# Patient Record
Sex: Female | Born: 1950 | Hispanic: No | Marital: Single | Smoking: Never smoker
Health system: Southern US, Community
[De-identification: ages and names within clinical notes are randomized; demographics above are authoritative.]

## PROBLEM LIST (undated history)

## (undated) DIAGNOSIS — R030 Elevated blood-pressure reading, without diagnosis of hypertension: Secondary | ICD-10-CM

## (undated) DIAGNOSIS — J45909 Unspecified asthma, uncomplicated: Secondary | ICD-10-CM

## (undated) DIAGNOSIS — T7840XA Allergy, unspecified, initial encounter: Secondary | ICD-10-CM

## (undated) HISTORY — DX: Elevated blood-pressure reading, without diagnosis of hypertension: R03.0

## (undated) HISTORY — DX: Allergy, unspecified, initial encounter: T78.40XA

---

## 1992-07-15 HISTORY — PX: UTERINE SUSPENSION: SUR1430

## 2015-05-16 DIAGNOSIS — IMO0001 Reserved for inherently not codable concepts without codable children: Secondary | ICD-10-CM

## 2015-05-16 HISTORY — DX: Reserved for inherently not codable concepts without codable children: IMO0001

## 2015-05-20 ENCOUNTER — Emergency Department (INDEPENDENT_AMBULATORY_CARE_PROVIDER_SITE_OTHER): Payer: PRIVATE HEALTH INSURANCE

## 2015-05-20 ENCOUNTER — Encounter (HOSPITAL_COMMUNITY): Payer: Self-pay | Admitting: Emergency Medicine

## 2015-05-20 ENCOUNTER — Emergency Department (INDEPENDENT_AMBULATORY_CARE_PROVIDER_SITE_OTHER)
Admission: EM | Admit: 2015-05-20 | Discharge: 2015-05-20 | Disposition: A | Discharge status: Home / Self Care | Payer: PRIVATE HEALTH INSURANCE | Source: Home / Self Care

## 2015-05-20 DIAGNOSIS — R059 Cough, unspecified: Secondary | ICD-10-CM

## 2015-05-20 DIAGNOSIS — J410 Simple chronic bronchitis: Secondary | ICD-10-CM

## 2015-05-20 DIAGNOSIS — J988 Other specified respiratory disorders: Secondary | ICD-10-CM

## 2015-05-20 DIAGNOSIS — R030 Elevated blood-pressure reading, without diagnosis of hypertension: Secondary | ICD-10-CM

## 2015-05-20 DIAGNOSIS — IMO0001 Reserved for inherently not codable concepts without codable children: Secondary | ICD-10-CM

## 2015-05-20 DIAGNOSIS — R05 Cough: Secondary | ICD-10-CM

## 2015-05-20 HISTORY — DX: Unspecified asthma, uncomplicated: J45.909

## 2015-05-20 MED ORDER — HYDROCODONE-HOMATROPINE 5-1.5 MG/5ML PO SYRP: 5.0000 mL | ORAL_SOLUTION | Freq: Four times a day (QID) | ORAL | Status: DC | PRN

## 2015-05-20 MED ORDER — ALBUTEROL SULFATE HFA 108 (90 BASE) MCG/ACT IN AERS: 2.0000 | INHALATION_SPRAY | Freq: Four times a day (QID) | RESPIRATORY_TRACT | Status: AC | PRN

## 2015-05-20 MED ORDER — AMOXICILLIN 500 MG PO CAPS: 500.0000 mg | ORAL_CAPSULE | Freq: Three times a day (TID) | ORAL | Status: DC

## 2015-05-20 MED ORDER — PREDNISOLONE 5 MG (48) PO TBPK: 1.0000 | ORAL_TABLET | ORAL | Status: AC

## 2015-05-20 NOTE — ED Notes (Signed)
[facility] with a complaint of coughing that has been occurring off and on for the previous three months. The patient stated that she has previously used a Ventolin inhaler to help with the coughing but she is out. She stated that she does have a hx of asthma.

## 2015-05-20 NOTE — Discharge Instructions (Signed)
Chronic Bronchitis Chronic bronchitis is a lasting inflammation of the bronchial tubes, which are the tubes that carry air into your lungs. This is inflammation that occurs:   On most days of the week.   For at least three months at a time.   Over a period of two years in a row. When the bronchial tubes are inflamed, they start to produce mucus. The inflammation and buildup of mucus make it more difficult to breathe. Chronic bronchitis is usually a permanent problem and is one type of chronic obstructive pulmonary disease (COPD). People with chronic bronchitis are at greater risk for getting repeated colds, or respiratory infections. CAUSES  Chronic bronchitis most often occurs in people who have:  Long-standing, severe asthma.  A history of smoking.  Asthma and who also smoke. SIGNS AND SYMPTOMS  Chronic bronchitis may cause the following:   A cough that brings up mucus (productive cough).  Shortness of breath.  Early morning headache.  Wheezing.  Chest discomfort.   Recurring respiratory infections. DIAGNOSIS  Your health care provider may confirm the diagnosis by:  Taking your medical history.  Performing a physical exam.  Taking a chest X-ray.   Performing pulmonary function tests. TREATMENT  Treatment involves controlling symptoms with medicines, oxygen therapy, or making lifestyle changes, such as exercising and eating a healthy, well-balanced diet. Medicines could include:  Inhalers to improve air flow in and out of your lungs.  Antibiotics to treat bacterial infections, such as pneumonia, sinus infections, and acute bronchitis. As a preventative measure, your health care provider may recommend routine vaccinations for influenza and pneumonia. This is to prevent infection and hospitalization since you may be more at risk for these types of infections.  HOME CARE INSTRUCTIONS  Take medicines only as directed by your health care provider.   If you smoke  cigarettes, chew tobacco, or use electronic cigarettes, quit. If you need help quitting, ask your health care provider.  Avoid pollen, dust, animal dander, molds, smoke, and other things that cause shortness of breath or wheezing attacks.  Talk to your health care provider about possible exercise routines. Regular exercise is very important to help you feel better.  If you are prescribed oxygen use at home follow these guidelines:  Never smoke while using oxygen. Oxygen does not burn or explode, but flammable materials will burn faster in the presence of oxygen.  Keep a Data processing manager close by. Let your fire department know that you have oxygen in your home.  Warn visitors not to smoke near you when you are using oxygen. Put up "no smoking" signs in your home where you most often use the oxygen.  Regularly test your smoke detectors at home to make sure they work. If you receive care in your home from a nurse or other health care provider, he or she may also check to make sure your smoke detectors work.  Ask your health care provider whether you would benefit from a pulmonary rehabilitation program.  Do not wait to get medical care if you have any concerning symptoms. Delays could cause permanent injury and may be life threatening. SEEK MEDICAL CARE IF:  You have increased coughing or shortness of breath or both.  You have muscle aches.  You have chest pain.  Your mucus gets thicker.  Your mucus changes from clear or white to yellow, green, gray, or bloody. SEEK IMMEDIATE MEDICAL CARE IF:  Your usual medicines do not stop your wheezing.   You have increased difficulty breathing.  You have any problems with the medicine you are taking, such as a rash, itching, swelling, or trouble breathing. MAKE SURE YOU:   Understand these instructions.  Will watch your condition.  Will get help right away if you are not doing well or get worse.   This information is not intended to  replace advice given to you by your health care provider. Make sure you discuss any questions you have with your health care provider.   Document Released: 04/18/2006 Document Revised: 07/22/2014 Document Reviewed: 08/09/2013 Elsevier Interactive Patient Education 2016 Elsevier Inc.  Managing Your High Blood Pressure Blood pressure is a measurement of how forceful your blood is pressing against the walls of the arteries. Arteries are muscular tubes within the circulatory system. Blood pressure does not stay the same. Blood pressure rises when you are active, excited, or nervous; and it lowers during sleep and relaxation. If the numbers measuring your blood pressure stay above normal most of the time, you are at risk for health problems. High blood pressure (hypertension) is a long-term (chronic) condition in which blood pressure is elevated. A blood pressure reading is recorded as two numbers, such as 120 over 80 (or 120/80). The first, higher number is called the systolic pressure. It is a measure of the pressure in your arteries as the heart beats. The second, lower number is called the diastolic pressure. It is a measure of the pressure in your arteries as the heart relaxes between beats.  Keeping your blood pressure in a normal range is important to your overall health and prevention of health problems, such as heart disease and stroke. When your blood pressure is uncontrolled, your heart has to work harder than normal. High blood pressure is a very common condition in adults because blood pressure tends to rise with age. Men and women are equally likely to have hypertension but at different times in life. Before age 84, men are more likely to have hypertension. After 64 years of age, women are more likely to have it. Hypertension is especially common in African Americans. This condition often has no signs or symptoms. The cause of the condition is usually not known. Your caregiver can help you come up  with a plan to keep your blood pressure in a normal, healthy range. BLOOD PRESSURE STAGES Blood pressure is classified into four stages: normal, prehypertension, stage 1, and stage 2. Your blood pressure reading will be used to determine what type of treatment, if any, is necessary. Appropriate treatment options are tied to these four stages:  Normal  Systolic pressure (mm Hg): below 120.  Diastolic pressure (mm Hg): below 80. Prehypertension  Systolic pressure (mm Hg): 120 to 139.  Diastolic pressure (mm Hg): 80 to 89. Stage1  Systolic pressure (mm Hg): 140 to 159.  Diastolic pressure (mm Hg): 90 to 99. Stage2  Systolic pressure (mm Hg): 160 or above.  Diastolic pressure (mm Hg): 100 or above. RISKS RELATED TO HIGH BLOOD PRESSURE Managing your blood pressure is an important responsibility. Uncontrolled high blood pressure can lead to:  A heart attack.  A stroke.  A weakened blood vessel (aneurysm).  Heart failure.  Kidney damage.  Eye damage.  Metabolic syndrome.  Memory and concentration problems. HOW TO MANAGE YOUR BLOOD PRESSURE Blood pressure can be managed effectively with lifestyle changes and medicines (if needed). Your caregiver will help you come up with a plan to bring your blood pressure within a normal range. Your plan should include the following: Education  Read all information provided by your caregivers about how to control blood pressure.  Educate yourself on the latest guidelines and treatment recommendations. New research is always being done to further define the risks and treatments for high blood pressure. Lifestylechanges  Control your weight.  Avoid smoking.  Stay physically active.  Reduce the amount of salt in your diet.  Reduce stress.  Control any chronic conditions, such as high cholesterol or diabetes.  Reduce your alcohol intake. Medicines  Several medicines (antihypertensive medicines) are available, if needed, to  bring blood pressure within a normal range. Communication  Review all the medicines you take with your caregiver because there may be side effects or interactions.  Talk with your caregiver about your diet, exercise habits, and other lifestyle factors that may be contributing to high blood pressure.  See your caregiver regularly. Your caregiver can help you create and adjust your plan for managing high blood pressure. RECOMMENDATIONS FOR TREATMENT AND FOLLOW-UP  The following recommendations are based on current guidelines for managing high blood pressure in nonpregnant adults. Use these recommendations to identify the proper follow-up period or treatment option based on your blood pressure reading. You can discuss these options with your caregiver.  Systolic pressure of 120 to 139 or diastolic pressure of 80 to 89: Follow up with your caregiver as directed.  Systolic pressure of 140 to 160 or diastolic pressure of 90 to 100: Follow up with your caregiver within 2 months.  Systolic pressure above 160 or diastolic pressure above 100: Follow up with your caregiver within 1 month.  Systolic pressure above 180 or diastolic pressure above 110: Consider antihypertensive therapy; follow up with your caregiver within 1 week.  Systolic pressure above 200 or diastolic pressure above 120: Begin antihypertensive therapy; follow up with your caregiver within 1 week.   This information is not intended to replace advice given to you by your health care provider. Make sure you discuss any questions you have with your health care provider.    It was nice to meet you. You have a respiratory infection in the setting of chronic bronchitis. Treat with antibiotic take all of it. Take prednisone for the inflammation (will be in a pack). Use the cough medicine for night time (it will make you sleepy). Use DELSYM over the counter for day time cough. AVOID anything decongestant it in because of blood pressure. Use  the inhaler as needed.  Please f/u with a PCP for management of your blood pressures.    Document Released: 03/25/2012 Document Reviewed: 03/25/2012 Elsevier Interactive Patient Education [employer]2016 Elsevier Inc.

## 2015-05-20 NOTE — ED Provider Notes (Signed)
CSN: [id number]     Arrival date & time 05/20/15  1258 History   None    Chief Complaint  Patient presents with  . Cough   (Consider location/radiation/quality/duration/timing/severity/associated sxs/prior Treatment) HPI Comments: 64 yo black female who presents with a 3 month history of cough. [place] from [place] and has not required medial attention at this time. No established PCP. Cough is productive, green to yellow. No fever or chills. Positive Malaise. Immunizations up to date. History of Asthma in [place] and uses PRN Ventolin which is without relief. She carries no history of HTN. Non-smoker. No CP or dyspnea. No headaches or change in vision.   Patient is a 64 y.o. female presenting with cough. The history is provided by the patient and a relative.  Cough Associated symptoms: wheezing   Associated symptoms: no chills, no fever, no shortness of breath and no sore throat     Past Medical History  Diagnosis Date  . Asthma    History reviewed. No pertinent past surgical history. History reviewed. No pertinent family history. Social History  Substance Use Topics  . Smoking status: Never Smoker   . Smokeless tobacco: None  . Alcohol Use: No   OB History    No data available     Review of Systems  Constitutional: Positive for fatigue. Negative for fever, chills and unexpected weight change.  HENT: Negative for congestion, sinus pressure and sore throat.   Respiratory: Positive for cough and wheezing. Negative for shortness of breath.   Skin: Negative.   Allergic/Immunologic: Negative.   Neurological: Negative for dizziness.  Psychiatric/Behavioral: Negative.     Allergies  Review of patient's allergies indicates no known allergies.  Home Medications   Prior to Admission medications   Medication Sig Start Date End Date Taking? Authorizing Provider  albuterol (PROVENTIL HFA;VENTOLIN HFA) 108 (90 BASE) MCG/ACT inhaler Inhale 2 puffs into the lungs  every 6 (six) hours as needed for wheezing or shortness of breath. 05/20/15   Riki SheerMichelle G Bethannie Iglehart, PA-C  amoxicillin (AMOXIL) 500 MG capsule Take 1 capsule (500 mg total) by mouth 3 (three) times daily. 05/20/15   Riki SheerMichelle G Kiyanna Biegler, PA-C  HYDROcodone-homatropine (HYCODAN) 5-1.5 MG/5ML syrup Take 5 mLs by mouth every 6 (six) hours as needed for cough. 05/20/15   Riki SheerMichelle G Marysa Wessner, PA-C  PrednisoLONE 5 MG (48) TBPK Take 1 Package by mouth as directed. 05/20/15 05/31/15  Riki SheerMichelle G Adison Reifsteck, PA-C   Meds Ordered and Administered this Visit  Medications - No data to display  BP 161/104 mmHg  Pulse 78  Temp(Src) 98.2 F (36.8 C) (Oral)  Resp 18  SpO2 99% No data found.   Physical Exam  Constitutional: She is oriented to person, place, and time. She appears well-developed and well-nourished. No distress.  HENT:  Head: Normocephalic and atraumatic.  Neck: Normal range of motion.  Cardiovascular: Normal rate, regular rhythm and normal heart sounds.   Pulmonary/Chest: Effort normal. No respiratory distress. She has wheezes. She exhibits no tenderness.  Few mild crackles and rhonchi in the bases, mild expiratory wheeze  Lymphadenopathy:    She has no cervical adenopathy.  Neurological: She is alert and oriented to person, place, and time.  Skin: Skin is warm and dry. No rash noted. She is not diaphoretic.  Psychiatric: Her behavior is normal.  Nursing note and vitals reviewed.   ED Course  Procedures (including critical care time)  Labs Review Labs Reviewed - No data to display  Imaging  Review Dg Chest 2 View  05/20/2015  CLINICAL DATA:  Cough for the past 3 months.  Night sweats.  Asthma. EXAM: CHEST  2 VIEW COMPARISON:  None. FINDINGS: Borderline enlarged cardiac silhouette. Tortuous and partially calcified thoracic aorta. The lungs are mildly hyperexpanded with mild diffuse peribronchial thickening. Mild thoracic spine degenerative changes. IMPRESSION: 1. Mild changes of COPD and chronic  bronchitis. 2. Borderline cardiomegaly. Electronically Signed   By: Beckie Salts M.D.   On: 05/20/2015 13:56     Visual Acuity Review  Right Eye Distance:   Left Eye Distance:   Bilateral Distance:    Right Eye Near:   Left Eye Near:    Bilateral Near:         MDM   1. Respiratory infection   2. Simple chronic bronchitis (HCC)   3. Cough   4. Elevated blood pressure    1-3.  No indication of PNA by CXR however given duration of symptoms will treat with ABX. Steroids for chronic inflammation and cough suppressant as needed at night. Rescue inhaler is refilled to use as directed. If worsens please f/u.  4. Elevated acutely in the setting of illness. No history. Suggest patient establish with PCP for further f/u-information given to arrange this. She is asymptomatic of HTN.       Riki Sheer, PA-C 05/20/15 1425

## 2015-05-30 ENCOUNTER — Encounter: Payer: Self-pay | Admitting: Internal Medicine

## 2015-05-30 ENCOUNTER — Ambulatory Visit (INDEPENDENT_AMBULATORY_CARE_PROVIDER_SITE_OTHER): Payer: Self-pay | Admitting: Internal Medicine

## 2015-05-30 VITALS — BP 144/90 | HR 80 | Ht 65.0 in | Wt 165.0 lb

## 2015-05-30 DIAGNOSIS — R739 Hyperglycemia, unspecified: Secondary | ICD-10-CM

## 2015-05-30 DIAGNOSIS — J45909 Unspecified asthma, uncomplicated: Secondary | ICD-10-CM | POA: Insufficient documentation

## 2015-05-30 DIAGNOSIS — T7840XA Allergy, unspecified, initial encounter: Secondary | ICD-10-CM | POA: Insufficient documentation

## 2015-05-30 DIAGNOSIS — R030 Elevated blood-pressure reading, without diagnosis of hypertension: Secondary | ICD-10-CM | POA: Insufficient documentation

## 2015-05-30 MED ORDER — FEXOFENADINE HCL 180 MG PO TABS: 180.0000 mg | ORAL_TABLET | Freq: Every day | ORAL | Status: AC

## 2015-05-30 NOTE — Progress Notes (Signed)
   Subjective:    Patient ID: Whitney Lucas, female    DOB: 09/23/1950, 64 y.o.   MRN: [id number]  HPI  64 yo female here to establish.  1.  Elevated Blood Pressure:  Was seen in Urgent Care for acute bronchitis about 10 days ago and was noted to have elevated bp.  Has never had high bp previously.  However had not had bp checked in 2-3 years.  No family history of hypertension.  No blood work done with urgent care visit.   2.  "Allergies":  Sounds like she is describing wheezing mainly, but also has itchy nose, ears,  and eyes without discharge.  + posterior pharyngeal drainage.  Really, a cough is her most prominent problem and that improves with a rescue inhaler.  States her symptoms are similar to when [place] lived in [place]. Symptoms can come on any time of year.  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  +Carpeting.  Vacuumed once a week or less. No mattress or pillow covers.   No insects or moisture issues, smell of mold or mildew.  Took unknown allergy pill from [place] in past, but stopped as her symptoms would return when she stopped the meds.             Review of Systems     Objective:   Physical Exam HEENT:  PERRL EOMI, Discs sharp.  No AV nicking noted Neck:  Supple, no adenopathy, no thyromegaly Chest:  CTA CV:  RRR with normal S1 and S2, no S3, S4, or murmur appreciated.  No carotid bruits, carotid, radial and DP pulses normal and equal. Abd:  S, NT, No HSM or mass. No bruit, +BS Extrems:  No edema Feet:  Flaking in between 4th and 5th toes and dorsum of foot in between        Assessment & Plan:  1.  Elevated BP without dx of hypertension:  Recheck bp when not in a stressful/anxiety provoking situation.  To return for nurse bp check.  2.  Allergies:  Discussed prevention/avoidance.  Vacuuming and cleaning bedclothes and pillow/mattress cover.  Fexofenadine 180 mg once daily to prevent allergies from triggering wheeze.  3.  Tinea Pedis:  Terbinafine cream  twice daily for 2 weeks.

## 2015-05-30 NOTE — Patient Instructions (Addendum)
Fexofenadine 180 mg once daily for allergies Get hypoallergenic mattress and pillow covers and wipe down weekly with Lysol wipes when you wash your sheets and blankets. Vacuum bed of dust when you change your sheets and blankets weekly Vacuum carpets twice weekly. Take shoes off at entrance door to avoid bringing in allergens from outdoors.  For feet rash:  Terbinafine cream--apply twice daily to areas of itching for 14 days.

## 2015-05-31 LAB — COMPREHENSIVE METABOLIC PANEL
ALBUMIN: 4 g/dL (ref 3.6–4.8)
ALK PHOS: 98 IU/L (ref 39–117)
ALT: 11 IU/L (ref 0–32)
AST: 11 IU/L (ref 0–40)
Albumin/Globulin Ratio: 1.3 (ref 1.1–2.5)
BUN / CREAT RATIO: 25 (ref 11–26)
BUN: 18 mg/dL (ref 8–27)
CHLORIDE: 100 mmol/L (ref 97–106)
CO2: 24 mmol/L (ref 18–29)
Calcium: 9.1 mg/dL (ref 8.7–10.3)
Creatinine, Ser: 0.71 mg/dL (ref 0.57–1.00)
GFR calc Af Amer: 104 mL/min/{1.73_m2} (ref >59)
GFR calc non Af Amer: 90 mL/min/{1.73_m2} (ref >59)
GLUCOSE: 119 mg/dL — AB (ref 65–99)
Globulin, Total: 3.2 g/dL (ref 1.5–4.5)
Potassium: 5 mmol/L (ref 3.5–5.2)
SODIUM: 139 mmol/L (ref 136–144)
Total Protein: 7.2 g/dL (ref 6.0–8.5)

## 2015-05-31 LAB — CBC WITH DIFFERENTIAL/PLATELET
BASOS ABS: 0.1 10*3/uL (ref 0.0–0.2)
Basos: 0 %
EOS (ABSOLUTE): 0.2 10*3/uL (ref 0.0–0.4)
Eos: 1 %
Hematocrit: 39.4 % (ref 34.0–46.6)
Hemoglobin: 12.9 g/dL (ref 11.1–15.9)
Immature Grans (Abs): 0.1 10*3/uL (ref 0.0–0.1)
Immature Granulocytes: 1 %
LYMPHS ABS: 2.2 10*3/uL (ref 0.7–3.1)
Lymphs: 16 %
MCH: 25 pg — ABNORMAL LOW (ref 26.6–33.0)
MCHC: 32.7 g/dL (ref 31.5–35.7)
MCV: 77 fL — ABNORMAL LOW (ref 79–97)
MONOCYTES: 5 %
MONOS ABS: 0.7 10*3/uL (ref 0.1–0.9)
NEUTROS PCT: 77 %
Neutrophils Absolute: 10.2 10*3/uL — ABNORMAL HIGH (ref 1.4–7.0)
Platelets: 425 10*3/uL — ABNORMAL HIGH (ref 150–379)
RBC: 5.15 x10E6/uL (ref 3.77–5.28)
RDW: 16.4 % — AB (ref 12.3–15.4)
WBC: 13.4 10*3/uL — AB (ref 3.4–10.8)

## 2015-05-31 NOTE — Addendum Note (Signed)
Addended by: Marcene DuosMULBERRY, Avishai Reihl M on: 05/31/2015 01:32 PM   Modules accepted: Orders

## 2015-06-02 LAB — HGB A1C W/O EAG: Hgb A1c MFr Bld: 6 % — ABNORMAL HIGH (ref 4.8–5.6)

## 2015-06-02 LAB — SPECIMEN STATUS REPORT

## 2015-06-05 ENCOUNTER — Other Ambulatory Visit (INDEPENDENT_AMBULATORY_CARE_PROVIDER_SITE_OTHER): Payer: Self-pay | Admitting: Internal Medicine

## 2015-06-05 DIAGNOSIS — D72829 Elevated white blood cell count, unspecified: Secondary | ICD-10-CM | POA: Insufficient documentation

## 2015-06-06 ENCOUNTER — Encounter (HOSPITAL_COMMUNITY): Payer: Self-pay | Admitting: Emergency Medicine

## 2015-06-06 DIAGNOSIS — R002 Palpitations: Secondary | ICD-10-CM | POA: Insufficient documentation

## 2015-06-06 DIAGNOSIS — Z79899 Other long term (current) drug therapy: Secondary | ICD-10-CM | POA: Insufficient documentation

## 2015-06-06 DIAGNOSIS — I10 Essential (primary) hypertension: Secondary | ICD-10-CM | POA: Insufficient documentation

## 2015-06-06 DIAGNOSIS — J45909 Unspecified asthma, uncomplicated: Secondary | ICD-10-CM | POA: Insufficient documentation

## 2015-06-06 LAB — CBC
HEMATOCRIT: 39.5 % (ref 36.0–46.0)
HEMOGLOBIN: 13.2 g/dL (ref 12.0–15.0)
MCH: 25.2 pg — ABNORMAL LOW (ref 26.0–34.0)
MCHC: 33.4 g/dL (ref 30.0–36.0)
MCV: 75.5 fL — AB (ref 78.0–100.0)
Platelets: 332 10*3/uL (ref 150–400)
RBC: 5.23 MIL/uL — ABNORMAL HIGH (ref 3.87–5.11)
RDW: 15.4 % (ref 11.5–15.5)
WBC: 9.6 10*3/uL (ref 4.0–10.5)

## 2015-06-06 LAB — CBC WITH DIFFERENTIAL/PLATELET
BASOS ABS: 0 10*3/uL (ref 0.0–0.2)
Basos: 0 %
EOS (ABSOLUTE): 0.4 10*3/uL (ref 0.0–0.4)
EOS: 4 %
Hematocrit: 40.6 % (ref 34.0–46.6)
Hemoglobin: 13.5 g/dL (ref 11.1–15.9)
IMMATURE GRANULOCYTES: 1 %
Immature Grans (Abs): 0.1 10*3/uL (ref 0.0–0.1)
LYMPHS ABS: 3.1 10*3/uL (ref 0.7–3.1)
Lymphs: 30 %
MCH: 25.5 pg — ABNORMAL LOW (ref 26.6–33.0)
MCHC: 33.3 g/dL (ref 31.5–35.7)
MCV: 77 fL — ABNORMAL LOW (ref 79–97)
MONOS ABS: 0.7 10*3/uL (ref 0.1–0.9)
Monocytes: 7 %
NEUTROS PCT: 58 %
Neutrophils Absolute: 5.9 10*3/uL (ref 1.4–7.0)
PLATELETS: 384 10*3/uL — AB (ref 150–379)
RBC: 5.29 x10E6/uL — AB (ref 3.77–5.28)
RDW: 16.7 % — AB (ref 12.3–15.4)
WBC: 10.2 10*3/uL (ref 3.4–10.8)

## 2015-06-06 LAB — BASIC METABOLIC PANEL
ANION GAP: 8 (ref 5–15)
BUN: 17 mg/dL (ref 6–20)
CO2: 23 mmol/L (ref 22–32)
Calcium: 8.9 mg/dL (ref 8.9–10.3)
Chloride: 105 mmol/L (ref 101–111)
Creatinine, Ser: 0.93 mg/dL (ref 0.44–1.00)
Glucose, Bld: 104 mg/dL — ABNORMAL HIGH (ref 65–99)
Potassium: 4.3 mmol/L (ref 3.5–5.1)
Sodium: 136 mmol/L (ref 135–145)

## 2015-06-06 LAB — I-STAT TROPONIN, ED: Troponin i, poc: 0.03 ng/mL (ref 0.00–0.08)

## 2015-06-06 NOTE — ED Notes (Addendum)
Pt states around 2pm she started having a mild headache 6/10 in "whole head' she has also felt a little fatigued today.Pt also reports this am felt like her heart was beating fast that only lasted for a few seconds. They were at [facility] and took pts b/p and bp read, "high" so decided to come be seen. BP on arrival to ED 136/79. Pt is alert and ox4. Pt has no neuro deficits .

## 2015-06-07 ENCOUNTER — Emergency Department (HOSPITAL_COMMUNITY)
Admission: EM | Admit: 2015-06-07 | Discharge: 2015-06-07 | Disposition: A | Discharge status: Home / Self Care | Payer: PRIVATE HEALTH INSURANCE | Attending: Emergency Medicine | Admitting: Emergency Medicine

## 2015-06-07 DIAGNOSIS — R51 Headache: Secondary | ICD-10-CM

## 2015-06-07 DIAGNOSIS — R002 Palpitations: Secondary | ICD-10-CM

## 2015-06-07 DIAGNOSIS — I10 Essential (primary) hypertension: Secondary | ICD-10-CM

## 2015-06-07 DIAGNOSIS — R519 Headache, unspecified: Secondary | ICD-10-CM

## 2015-06-07 MED ORDER — METOPROLOL TARTRATE 25 MG PO TABS: 25.0000 mg | ORAL_TABLET | Freq: Two times a day (BID) | ORAL | Status: AC

## 2015-06-07 NOTE — Discharge Instructions (Signed)
General Headache Without Cause A headache is pain or discomfort felt around the head or neck area. There are many causes and types of headaches. In some cases, the cause may not be found.  HOME CARE  Managing Pain  Take over-the-counter and prescription medicines only as told by your doctor.  Lie down in a dark, quiet room when you have a headache.  If directed, apply ice to the head and neck area:  Put ice in a plastic bag.  Place a towel between your skin and the bag.  Leave the ice on for 20 minutes, 2-3 times per day.  Use a heating pad or hot shower to apply heat to the head and neck area as told by your doctor.  Keep lights dim if bright lights bother you or make your headaches worse. Eating and Drinking  Eat meals on a regular schedule.  Lessen how much alcohol you drink.  Lessen how much caffeine you drink, or stop drinking caffeine. General Instructions  Keep all follow-up visits as told by your doctor. This is important.  Keep a journal to find out if certain things bring on headaches. For example, write down:  What you eat and drink.  How much sleep you get.  Any change to your diet or medicines.  Relax by getting a massage or doing other relaxing activities.  Lessen stress.  Sit up straight. Do not tighten (tense) your muscles.  Do not use tobacco products. This includes cigarettes, chewing tobacco, or e-cigarettes. If you need help quitting, ask your doctor.  Exercise regularly as told by your doctor.  Get enough sleep. This often means 7-9 hours of sleep. GET HELP IF:  Your symptoms are not helped by medicine.  You have a headache that feels different than the other headaches.  You feel sick to your stomach (nauseous) or you throw up (vomit).  You have a fever. GET HELP RIGHT AWAY IF:   Your headache becomes really bad.  You keep throwing up.  You have a stiff neck.  You have trouble seeing.  You have trouble speaking.  You have  pain in the eye or ear.  Your muscles are weak or you lose muscle control.  You lose your balance or have trouble walking.  You feel like you will pass out (faint) or you pass out.  You have confusion.   This information is not intended to replace advice given to you by your health care provider. Make sure you discuss any questions you have with your health care provider.   Document Released: 04/09/2008 Document Revised: 03/22/2015 Document Reviewed: 10/24/2014 Elsevier Interactive Patient Education 2016 [employer].  Hypertension Hypertension is another name for high blood pressure. High blood pressure forces your heart to work harder to pump blood. A blood pressure reading has two numbers, which includes a higher number over a lower number (example: 110/72). HOME CARE   Have your blood pressure rechecked by your doctor.  Only take medicine as told by your doctor. Follow the directions carefully. The medicine does not work as well if you skip doses. Skipping doses also puts you at risk for problems.  Do not smoke.  Monitor your blood pressure at home as told by your doctor. GET HELP IF:  You think you are having a reaction to the medicine you are taking.  You have repeat headaches or feel dizzy.  You have puffiness (swelling) in your ankles.  You have trouble with your vision. GET HELP RIGHT AWAY IF:  You get a very bad headache and are confused.  You feel weak, numb, or faint.  You get chest or belly (abdominal) pain.  You throw up (vomit).  You cannot breathe very well. MAKE SURE YOU:   Understand these instructions.  Will watch your condition.  Will get help right away if you are not doing well or get worse.   This information is not intended to replace advice given to you by your health care provider. Make sure you discuss any questions you have with your health care provider.   Document Released: 12/18/2007 Document Revised: 07/06/2013 Document  Reviewed: 04/23/2013 Elsevier Interactive Patient Education 2016 [employer]  Palpitations A palpitation is the feeling that your heartbeat is irregular. It may feel like your heart is fluttering or skipping a beat. It may also feel like your heart is beating faster than normal. This is usually not a serious problem. In some cases, you may need more medical tests. HOME CARE  Avoid:  Caffeine in coffee, tea, soft drinks, diet pills, and energy drinks.  Chocolate.  Alcohol.  Stop smoking if you smoke.  Reduce your stress and anxiety. Try:  A method that measures bodily functions so you can learn to control them (biofeedback).  Yoga.  Meditation.  Physical activity such as swimming, jogging, or walking.  Get plenty of rest and sleep. GET HELP IF:  Your fast or irregular heartbeat continues after 24 hours.  Your palpitations occur more often. GET HELP RIGHT AWAY IF:   You have chest pain.  You feel short of breath.  You have a very bad headache.  You feel dizzy or pass out (faint). MAKE SURE YOU:   Understand these instructions.  Will watch your condition.  Will get help right away if you are not doing well or get worse.   This information is not intended to replace advice given to you by your health care provider. Make sure you discuss any questions you have with your health care provider.   Document Released: 04/09/2008 Document Revised: 07/22/2014 Document Reviewed: 08/30/2011 Elsevier Interactive Patient Education [employer] Elsevier Inc.

## 2015-06-07 NOTE — ED Provider Notes (Signed)
CSN: [id number]     Arrival date & time 06/06/15  2006 History  By signing my name below, I, Emmanuella Mensah, attest that this documentation has been prepared under the direction and in the presence of Gilda Crease, MD. Electronically Signed: Angelene Giovanni, ED Scribe. 06/07/2015. 1:06 AM.    Chief Complaint  Patient presents with  . Headache   The history is provided by the patient. No language interpreter was used.   HPI Comments: Whitney Lucas is a 64 y.o. female who presents to the Emergency Department complaining of gradually improving generalized throbbing HA onset yesterday afternoon. She explains that her HA started out slowly and gradually worsened before she could get relief. She reports associated episodes of palpitations that lasted a few seconds. She adds that the palpitations resolved after taking a heart medication prescribed by her PCP. She denies any visual disturbances, SOB, or numbness/tingling in arms. She report that she took one aspirin for her symptoms. She states that she is here because she went to check her BP at [facility] and the machine read "high".    Past Medical History  Diagnosis Date  . Asthma     Allergy symptoms trigger  . Allergy     Eye, ear, nose, throat symptoms  . Elevated BP 05/2015   Past Surgical History  Procedure Laterality Date  . Uterine suspension  1994    Had pelvic relaxation surgery   No family history on file. Social History  Substance Use Topics  . Smoking status: Never Smoker   . Smokeless tobacco: Never Used  . Alcohol Use: No   OB History    No data available     Review of Systems  Constitutional: Negative for fever.  Eyes: Negative for visual disturbance.  Respiratory: Negative for shortness of breath.   Cardiovascular: Positive for palpitations.  Neurological: Positive for headaches. Negative for numbness.  All other systems reviewed and are negative.     Allergies  Review of patient's allergies  indicates no known allergies.  Home Medications   Prior to Admission medications   Medication Sig Start Date End Date Taking? Authorizing Provider  albuterol (PROVENTIL HFA;VENTOLIN HFA) 108 (90 BASE) MCG/ACT inhaler Inhale 2 puffs into the lungs every 6 (six) hours as needed for wheezing or shortness of breath. Patient not taking: Reported on 05/30/2015 05/20/15   Riki Sheer, PA-C  fexofenadine (ALLEGRA) 180 MG tablet Take 1 tablet (180 mg total) by mouth daily. 05/30/15   Julieanne Manson, MD  metoprolol (LOPRESSOR) 25 MG tablet Take 1 tablet (25 mg total) by mouth 2 (two) times daily. 06/07/15   Gilda Crease, MD   BP 137/79 mmHg  Pulse 58  Temp(Src) 98.3 F (36.8 C) (Oral)  Resp 18  Ht  (1.702 m)  Wt 170 lb (77.111 kg)  BMI 26.62 kg/m2  SpO2 99% Physical Exam  Constitutional: She is oriented to person, place, and time. She appears well-developed and well-nourished. No distress.  HENT:  Head: Normocephalic and atraumatic.  Right Ear: Hearing normal.  Left Ear: Hearing normal.  Nose: Nose normal.  Mouth/Throat: Oropharynx is clear and moist and mucous membranes are normal.  Eyes: Conjunctivae and EOM are normal. Pupils are equal, round, and reactive to light.  Neck: Normal range of motion. Neck supple.  Cardiovascular: Regular rhythm, S1 normal and S2 normal.  Exam reveals no gallop and no friction rub.   No murmur heard. Pulmonary/Chest: Effort normal and breath sounds normal. No respiratory distress. She  exhibits no tenderness.  Abdominal: Soft. Normal appearance and bowel sounds are normal. There is no hepatosplenomegaly. There is no tenderness. There is no rebound, no guarding, no tenderness at McBurney's point and negative Murphy's sign. No hernia.  Musculoskeletal: Normal range of motion.  Neurological: She is alert and oriented to person, place, and time. She has normal strength. No cranial nerve deficit or sensory deficit. Coordination normal. GCS eye  subscore is 4. GCS verbal subscore is 5. GCS motor subscore is 6.  Extraocular muscle movement: normal No visual field cut Pupils: equal and reactive both direct and consensual response is normal No nystagmus present    Sensory function is intact to light touch, pinprick Proprioception intact  Grip strength 5/5 symmetric in upper extremities No pronator drift Normal finger to nose bilaterally  Lower extremity strength 5/5 against gravity Normal heel to shin bilaterally  Gait: normal   Skin: Skin is warm, dry and intact. No rash noted. No cyanosis.  Psychiatric: She has a normal mood and affect. Her speech is normal and behavior is normal. Thought content normal.  Nursing note and vitals reviewed.   ED Course  Procedures (including critical care time) DIAGNOSTIC STUDIES: Oxygen Saturation is 98% on RA, normal by my interpretation.    COORDINATION OF CARE: 12:25 AM- Pt advised of plan for treatment and pt agrees. Will refer to Cardiologist.     Labs Review Labs Reviewed  BASIC METABOLIC PANEL - Abnormal; Notable for the following:    Glucose, Bld 104 (*)    All other components within normal limits  CBC - Abnormal; Notable for the following:    RBC 5.23 (*)    MCV 75.5 (*)    MCH 25.2 (*)    All other components within normal limits  I-STAT TROPOININ, ED    Imaging Review No results found.   Gilda Crease, MD has personally reviewed and evaluated these images and lab results as part of her medical decision-making.   EKG Interpretation None       ED ECG REPORT   Date: 06/07/2015  Rate: 68  Rhythm: normal sinus rhythm  QRS Axis: normal  Intervals: normal  ST/T Wave abnormalities: normal  Conduction Disutrbances:none  Narrative Interpretation: normal EKG  Old EKG Reviewed: none available  I have personally reviewed the EKG tracing and agree with the computerized printout as noted.   MDM   Final diagnoses:  Headache, unspecified headache type   Essential hypertension  Heart palpitations    Presented to the ER for evaluation of multiple problems. Patient reports that she developed headache around 2 PM. She reports that it was mild at first but then progressively worsened, slowly over time. She reports throbbing headache across the back of her head. He cut better after she took Tylenol and is currently completely resolved. When she was experiencing a headache, she went to [facility] and took her blood pressure and it was high. Reviewing the records reveals that her blood pressure has been high on multiple occasions in the past. She has been seen in urgent care recently and her blood pressure was 160/100 that time. She likely has some element of essential hypertension. As a headache is completely resolved and she has a normal neurologic evaluation, she does not require any imaging at this time.  She also reports that earlier today, around 9:00am she experienced palpitations. She reports that her heart was racing, lasted for a short period of time and then resolved. This has occurred in the past  as well. There is no associated chest pain. She has not identified anything that causes her improved symptoms. I will therefore refer her to cardiology for further testing and monitoring. She has not had any symptoms or arrhythmia here in the ER. Will initiate Lopressor for symptomatic treatment of her palpitations as well as blood pressure control, pending follow-up with cardiology.  I personally performed the services described in this documentation, which was scribed in my presence. The recorded information has been reviewed and is accurate.   Gilda Creasehristopher J Christpher Stogsdill, MD 06/07/15 [phone]

## 2015-06-14 NOTE — Progress Notes (Signed)
Quick Note:  06/14/15 Patient informed. ______

## 2015-06-14 NOTE — Progress Notes (Signed)
Quick Note: ° °06/14/15 Patient informed. °______ °

## 2015-08-09 ENCOUNTER — Ambulatory Visit: Payer: No Typology Code available for payment source | Admitting: Internal Medicine

## 2017-04-05 IMAGING — DX DG CHEST 2V
2 series · 2 of 2 positions shown · non-contrast
Comparison: None.

CLINICAL DATA: Cough for the past 3 months.  Night sweats.  Asthma.

EXAM:
CHEST  2 VIEW

[chest pa]
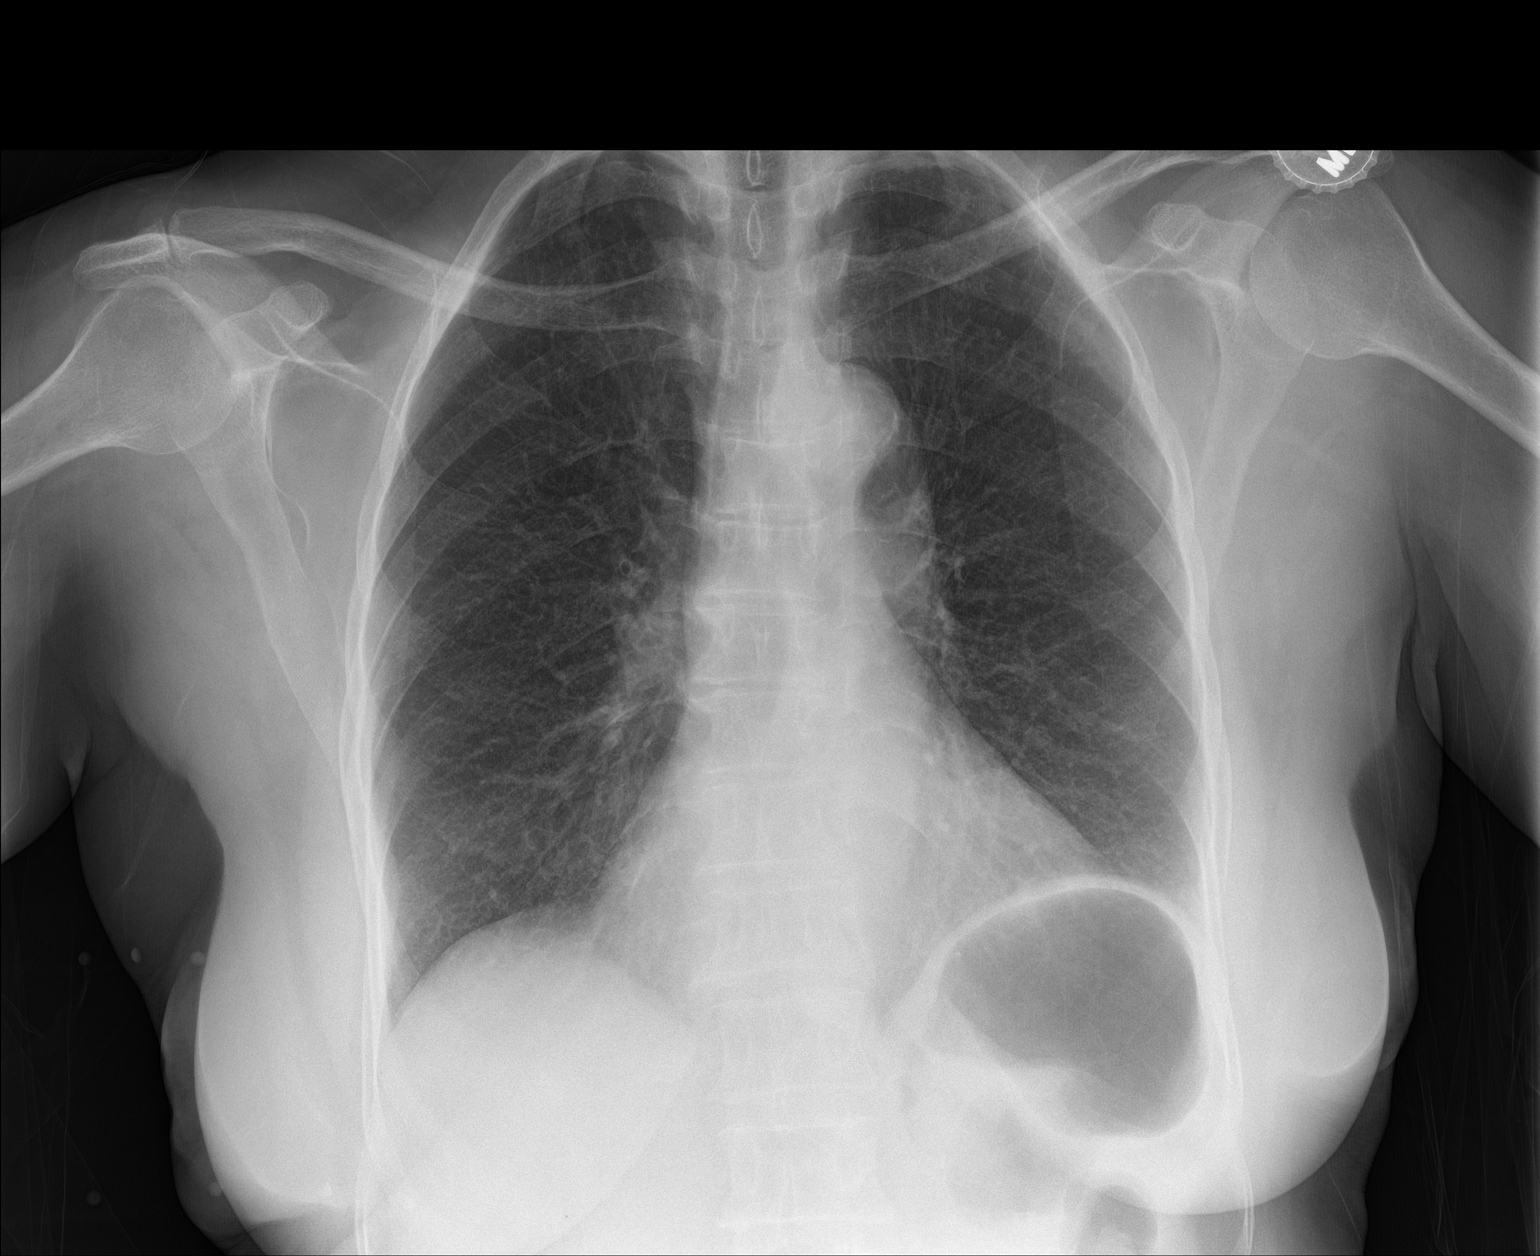

[chest lat]
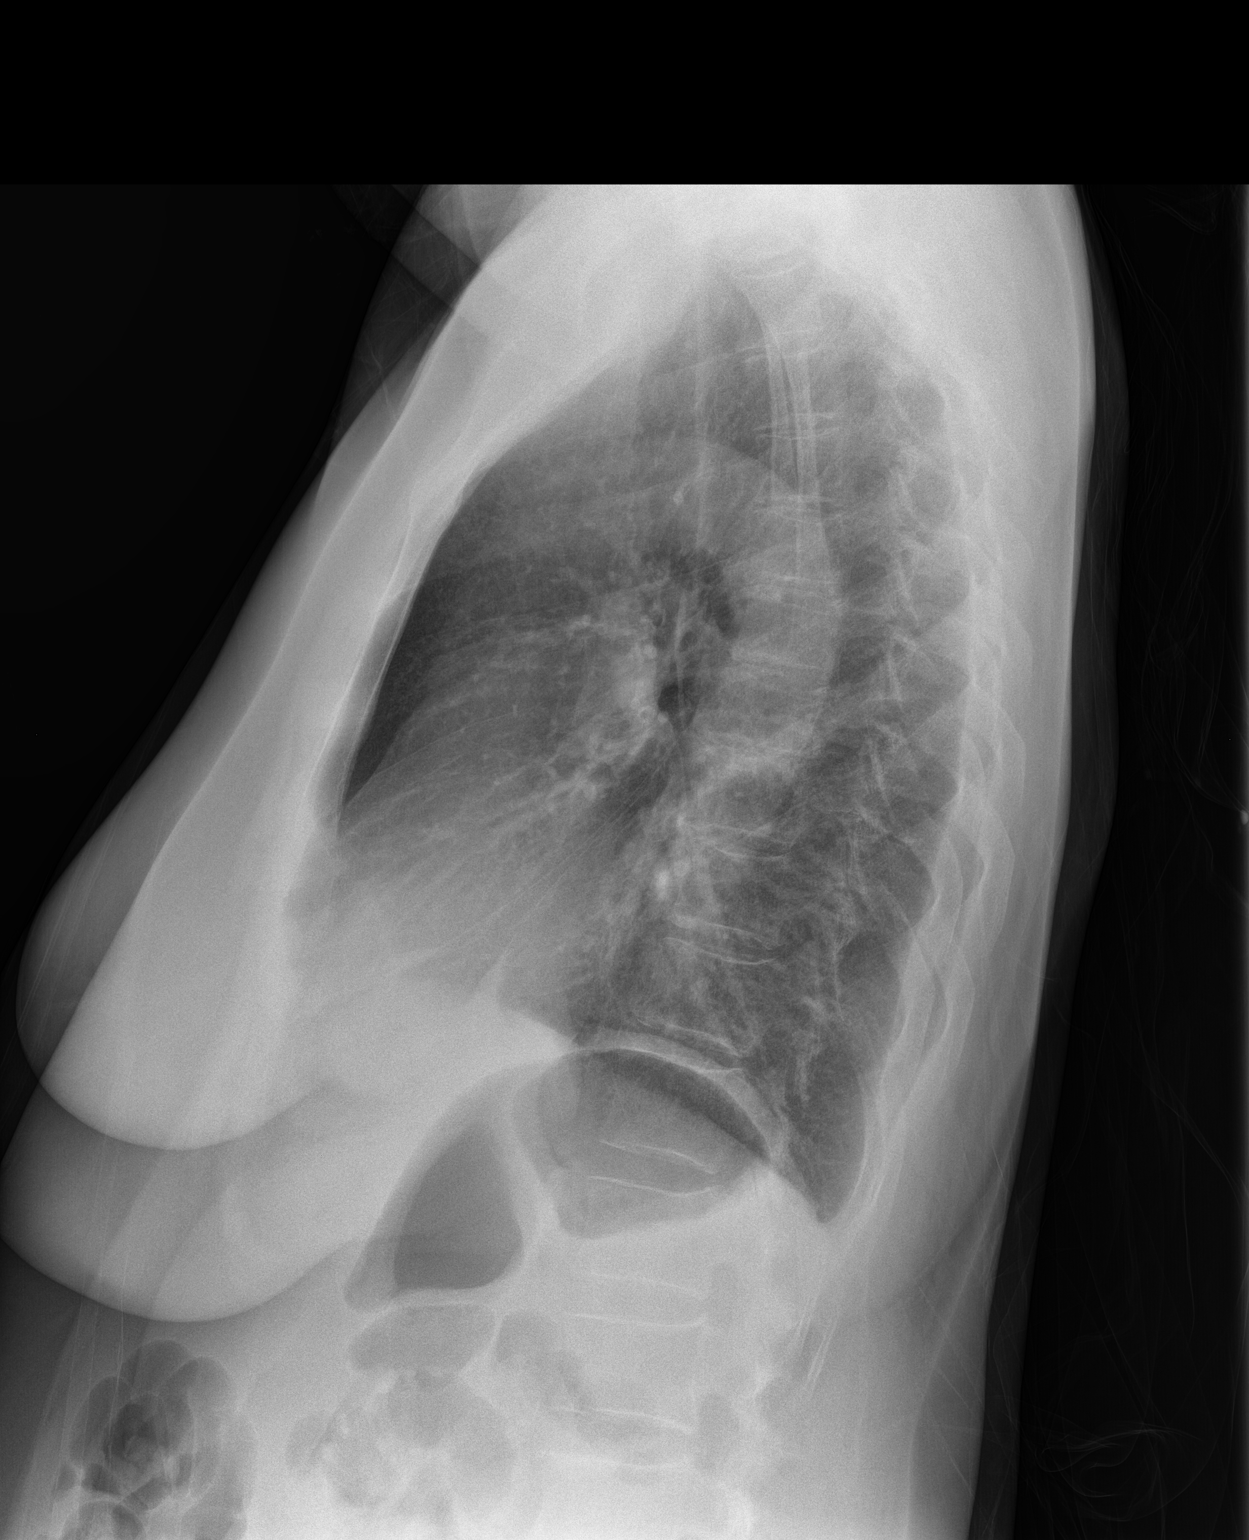

[2 of 2 positions shown; findings below may reference images not displayed]

FINDINGS: Borderline enlarged cardiac silhouette. Tortuous and partially
calcified thoracic aorta. The lungs are mildly hyperexpanded with
mild diffuse peribronchial thickening. Mild thoracic spine
degenerative changes.
IMPRESSION: 1. Mild changes of COPD and chronic bronchitis.
2. Borderline cardiomegaly.
